# Patient Record
Sex: Female | Born: 1935 | Race: Black or African American | Hispanic: No
Health system: Southern US, Community
[De-identification: ages and names within clinical notes are randomized; demographics above are authoritative.]

---

## 2011-10-17 ENCOUNTER — Inpatient Hospital Stay: Payer: Self-pay | Admitting: Internal Medicine

## 2011-10-17 LAB — COMPREHENSIVE METABOLIC PANEL
Albumin: 2.4 g/dL — ABNORMAL LOW (ref 3.4–5.0)
Alkaline Phosphatase: 88 U/L (ref 50–136)
Anion Gap: 13 (ref 7–16)
BUN: 27 mg/dL — ABNORMAL HIGH (ref 7–18)
Bilirubin,Total: 0.5 mg/dL (ref 0.2–1.0)
Co2: 26 mmol/L (ref 21–32)
Creatinine: 1.59 mg/dL — ABNORMAL HIGH (ref 0.60–1.30)
EGFR (African American): 40 — ABNORMAL LOW
Osmolality: 289 (ref 275–301)
Potassium: 3.8 mmol/L (ref 3.5–5.1)
SGPT (ALT): 11 U/L — ABNORMAL LOW
Sodium: 141 mmol/L (ref 136–145)
Total Protein: 7.7 g/dL (ref 6.4–8.2)

## 2011-10-17 LAB — URINALYSIS, COMPLETE
Hyaline Cast: 18
Ketone: NEGATIVE
Nitrite: NEGATIVE
Ph: 5 (ref 4.5–8.0)
Protein: 100
Specific Gravity: 1.014 (ref 1.003–1.030)
WBC UR: 3885 /HPF (ref 0–5)

## 2011-10-17 LAB — CBC WITH DIFFERENTIAL/PLATELET
Basophil %: 0.2 %
Eosinophil #: 0 10*3/uL (ref 0.0–0.7)
HCT: 31.4 % — ABNORMAL LOW (ref 35.0–47.0)
HGB: 10.3 g/dL — ABNORMAL LOW (ref 12.0–16.0)
Lymphocyte #: 1 10*3/uL (ref 1.0–3.6)
Lymphocyte %: 9 %
MCHC: 32.9 g/dL (ref 32.0–36.0)
MCV: 87 fL (ref 80–100)
Neutrophil #: 8.9 10*3/uL — ABNORMAL HIGH (ref 1.4–6.5)
Neutrophil %: 79.6 %
WBC: 11 10*3/uL (ref 3.6–11.0)

## 2011-10-17 LAB — PRO B NATRIURETIC PEPTIDE: B-Type Natriuretic Peptide: 5271 pg/mL — ABNORMAL HIGH (ref 0–450)

## 2011-10-17 LAB — CK TOTAL AND CKMB (NOT AT ARMC)
CK, Total: 22 U/L (ref 21–215)
CK-MB: <0.5 ng/mL — ABNORMAL LOW (ref 0.5–3.6)

## 2011-10-17 LAB — RAPID INFLUENZA A&B ANTIGENS

## 2011-10-18 LAB — CBC WITH DIFFERENTIAL/PLATELET
Basophil #: 0 10*3/uL (ref 0.0–0.1)
Eosinophil %: 1 %
HCT: 30.6 % — ABNORMAL LOW (ref 35.0–47.0)
Lymphocyte #: 1.2 10*3/uL (ref 1.0–3.6)
MCH: 28.5 pg (ref 26.0–34.0)
MCHC: 32.5 g/dL (ref 32.0–36.0)
MCV: 88 fL (ref 80–100)
Monocyte #: 1.2 10*3/uL — ABNORMAL HIGH (ref 0.0–0.7)
Monocyte %: 11.7 %
Neutrophil #: 7.6 10*3/uL — ABNORMAL HIGH (ref 1.4–6.5)
Platelet: 411 10*3/uL (ref 150–440)
RDW: 14.2 % (ref 11.5–14.5)

## 2011-10-18 LAB — BASIC METABOLIC PANEL
Calcium, Total: 9.5 mg/dL (ref 8.5–10.1)
Creatinine: 1.39 mg/dL — ABNORMAL HIGH (ref 0.60–1.30)
EGFR (African American): 47 — ABNORMAL LOW
Glucose: 128 mg/dL — ABNORMAL HIGH (ref 65–99)

## 2011-10-18 LAB — PROTIME-INR
INR: 1.2
Prothrombin Time: 15.9 secs — ABNORMAL HIGH (ref 11.5–14.7)

## 2011-10-19 LAB — URINE CULTURE

## 2011-10-19 LAB — VANCOMYCIN, TROUGH: Vancomycin, Trough: 16 ug/mL (ref 10–20)

## 2011-10-20 LAB — BASIC METABOLIC PANEL
Anion Gap: 9 (ref 7–16)
Calcium, Total: 9.7 mg/dL (ref 8.5–10.1)
Co2: 29 mmol/L (ref 21–32)
Creatinine: 1.42 mg/dL — ABNORMAL HIGH (ref 0.60–1.30)
EGFR (African American): 46 — ABNORMAL LOW
EGFR (Non-African Amer.): 38 — ABNORMAL LOW
Glucose: 215 mg/dL — ABNORMAL HIGH (ref 65–99)
Sodium: 139 mmol/L (ref 136–145)

## 2011-10-20 LAB — CBC WITH DIFFERENTIAL/PLATELET
Basophil #: 0.1 10*3/uL (ref 0.0–0.1)
Eosinophil #: 0 10*3/uL (ref 0.0–0.7)
Eosinophil %: 0 %
Lymphocyte #: 0.6 10*3/uL — ABNORMAL LOW (ref 1.0–3.6)
Lymphocyte %: 3.9 %
MCH: 28.2 pg (ref 26.0–34.0)
MCHC: 32.7 g/dL (ref 32.0–36.0)
MCV: 86 fL (ref 80–100)
Monocyte %: 4.9 %
Neutrophil #: 13.6 10*3/uL — ABNORMAL HIGH (ref 1.4–6.5)
Platelet: 464 10*3/uL — ABNORMAL HIGH (ref 150–440)
RBC: 3.73 10*6/uL — ABNORMAL LOW (ref 3.80–5.20)

## 2011-10-20 LAB — BRONCHIAL WASH CULTURE

## 2011-10-21 LAB — CBC WITH DIFFERENTIAL/PLATELET
Basophil #: 0 10*3/uL (ref 0.0–0.1)
Basophil %: 0 %
Eosinophil %: 0 %
HGB: 9.9 g/dL — ABNORMAL LOW (ref 12.0–16.0)
Lymphocyte #: 0.6 10*3/uL — ABNORMAL LOW (ref 1.0–3.6)
MCH: 28.2 pg (ref 26.0–34.0)
MCV: 87 fL (ref 80–100)
Monocyte #: 0.9 10*3/uL — ABNORMAL HIGH (ref 0.0–0.7)
Monocyte %: 6.6 %
Neutrophil %: 89.2 %
Platelet: 436 10*3/uL (ref 150–440)
RBC: 3.52 10*6/uL — ABNORMAL LOW (ref 3.80–5.20)
WBC: 13.2 10*3/uL — ABNORMAL HIGH (ref 3.6–11.0)

## 2011-10-21 LAB — BASIC METABOLIC PANEL
Anion Gap: 6 — ABNORMAL LOW (ref 7–16)
Calcium, Total: 9.5 mg/dL (ref 8.5–10.1)
Chloride: 101 mmol/L (ref 98–107)
Co2: 30 mmol/L (ref 21–32)
Creatinine: 1.07 mg/dL (ref 0.60–1.30)
EGFR (Non-African Amer.): 52 — ABNORMAL LOW
Osmolality: 288 (ref 275–301)

## 2011-10-22 LAB — OCCULT BLOOD X 1 CARD TO LAB, STOOL: Occult Blood, Feces: POSITIVE

## 2011-10-22 LAB — CBC WITH DIFFERENTIAL/PLATELET
Basophil #: 0 10*3/uL (ref 0.0–0.1)
Basophil %: 0.2 %
Eosinophil %: 0.1 %
Lymphocyte #: 1.2 10*3/uL (ref 1.0–3.6)
MCHC: 32.1 g/dL (ref 32.0–36.0)
MCV: 87 fL (ref 80–100)
Monocyte %: 11.6 %
Neutrophil #: 8.6 10*3/uL — ABNORMAL HIGH (ref 1.4–6.5)
RBC: 3.61 10*6/uL — ABNORMAL LOW (ref 3.80–5.20)
RDW: 14.1 % (ref 11.5–14.5)
WBC: 11.2 10*3/uL — ABNORMAL HIGH (ref 3.6–11.0)

## 2011-10-23 LAB — CULTURE, BLOOD (SINGLE)

## 2011-12-01 LAB — TROPONIN I: Troponin-I: <0.02 ng/mL

## 2011-12-01 LAB — COMPREHENSIVE METABOLIC PANEL
Albumin: 3.1 g/dL — ABNORMAL LOW (ref 3.4–5.0)
Alkaline Phosphatase: 72 U/L (ref 50–136)
BUN: 19 mg/dL — ABNORMAL HIGH (ref 7–18)
Calcium, Total: 8.7 mg/dL (ref 8.5–10.1)
Co2: 20 mmol/L — ABNORMAL LOW (ref 21–32)
Creatinine: 1.15 mg/dL (ref 0.60–1.30)
EGFR (African American): 51 — ABNORMAL LOW
Glucose: 144 mg/dL — ABNORMAL HIGH (ref 65–99)
Osmolality: 290 (ref 275–301)
SGOT(AST): 17 U/L (ref 15–37)
SGPT (ALT): 10 U/L — ABNORMAL LOW
Total Protein: 6.7 g/dL (ref 6.4–8.2)

## 2011-12-01 LAB — CBC
HGB: 9 g/dL — ABNORMAL LOW (ref 12.0–16.0)
MCV: 86 fL (ref 80–100)
RBC: 3.5 10*6/uL — ABNORMAL LOW (ref 3.80–5.20)
RDW: 16.8 % — ABNORMAL HIGH (ref 11.5–14.5)

## 2011-12-02 ENCOUNTER — Inpatient Hospital Stay: Payer: Self-pay | Admitting: Internal Medicine

## 2011-12-02 LAB — TROPONIN I: Troponin-I: <0.02 ng/mL

## 2011-12-02 LAB — CBC WITH DIFFERENTIAL/PLATELET
Bands: 2 %
Eosinophil: 1 %
Lymphocytes: 22 %
Monocytes: 5 %
RBC: 3.23 10*6/uL — ABNORMAL LOW (ref 3.80–5.20)
Segmented Neutrophils: 70 %
WBC: 9.9 10*3/uL (ref 3.6–11.0)

## 2011-12-02 LAB — URINALYSIS, COMPLETE
Bilirubin,UR: NEGATIVE
Glucose,UR: NEGATIVE mg/dL (ref 0–75)
Hyaline Cast: 4
Nitrite: POSITIVE
Ph: 5 (ref 4.5–8.0)
RBC,UR: 3 /HPF (ref 0–5)
Specific Gravity: 1.014 (ref 1.003–1.030)
Squamous Epithelial: <1

## 2011-12-02 LAB — CK TOTAL AND CKMB (NOT AT ARMC)
CK, Total: 27 U/L (ref 21–215)
CK, Total: 20 U/L — ABNORMAL LOW (ref 21–215)
CK, Total: 20 U/L — ABNORMAL LOW (ref 21–215)
CK-MB: <0.5 ng/mL — ABNORMAL LOW (ref 0.5–3.6)
CK-MB: <0.5 ng/mL — ABNORMAL LOW (ref 0.5–3.6)

## 2011-12-03 LAB — CBC WITH DIFFERENTIAL/PLATELET
Basophil #: 0.1 10*3/uL (ref 0.0–0.1)
Basophil %: 1.3 %
HCT: 26.6 % — ABNORMAL LOW (ref 35.0–47.0)
HGB: 8.3 g/dL — ABNORMAL LOW (ref 12.0–16.0)
Lymphocyte #: 1.4 10*3/uL (ref 1.0–3.6)
Lymphocyte %: 18.5 %
Monocyte %: 10.7 %
Neutrophil #: 5.3 10*3/uL (ref 1.4–6.5)
Neutrophil %: 68.7 %
Platelet: 340 10*3/uL (ref 150–440)
RDW: 16.9 % — ABNORMAL HIGH (ref 11.5–14.5)
WBC: 7.7 10*3/uL (ref 3.6–11.0)

## 2011-12-03 LAB — BASIC METABOLIC PANEL
Anion Gap: 10 (ref 7–16)
BUN: 18 mg/dL (ref 7–18)
Calcium, Total: 9 mg/dL (ref 8.5–10.1)
Co2: 25 mmol/L (ref 21–32)
Creatinine: 1.22 mg/dL (ref 0.60–1.30)
EGFR (Non-African Amer.): 41 — ABNORMAL LOW
Osmolality: 286 (ref 275–301)
Potassium: 3.6 mmol/L (ref 3.5–5.1)
Sodium: 142 mmol/L (ref 136–145)

## 2011-12-03 LAB — MAGNESIUM: Magnesium: 1.7 mg/dL — ABNORMAL LOW

## 2011-12-04 LAB — CBC WITH DIFFERENTIAL/PLATELET
Basophil #: 0 10*3/uL (ref 0.0–0.1)
Basophil %: 0.8 %
Eosinophil #: 0.1 10*3/uL (ref 0.0–0.7)
Lymphocyte #: 1.4 10*3/uL (ref 1.0–3.6)
Platelet: 337 10*3/uL (ref 150–440)

## 2011-12-04 LAB — BASIC METABOLIC PANEL
Anion Gap: 10 (ref 7–16)
BUN: 18 mg/dL (ref 7–18)
Chloride: 108 mmol/L — ABNORMAL HIGH (ref 98–107)
Creatinine: 1.2 mg/dL (ref 0.60–1.30)
EGFR (African American): 48 — ABNORMAL LOW
EGFR (Non-African Amer.): 42 — ABNORMAL LOW
Glucose: 109 mg/dL — ABNORMAL HIGH (ref 65–99)
Potassium: 3.9 mmol/L (ref 3.5–5.1)

## 2011-12-05 LAB — BASIC METABOLIC PANEL
Anion Gap: 8 (ref 7–16)
Calcium, Total: 8.9 mg/dL (ref 8.5–10.1)
Chloride: 108 mmol/L — ABNORMAL HIGH (ref 98–107)
Creatinine: 1.3 mg/dL (ref 0.60–1.30)
EGFR (African American): 44 — ABNORMAL LOW
EGFR (Non-African Amer.): 38 — ABNORMAL LOW
Osmolality: 284 (ref 275–301)
Potassium: 3.5 mmol/L (ref 3.5–5.1)

## 2011-12-05 LAB — CBC WITH DIFFERENTIAL/PLATELET
Basophil #: 0.1 10*3/uL (ref 0.0–0.1)
Basophil %: 2.4 %
Eosinophil #: 0.2 10*3/uL (ref 0.0–0.7)
Eosinophil %: 3.1 %
Lymphocyte %: 27.6 %
MCH: 26.4 pg (ref 26.0–34.0)
MCHC: 32 g/dL (ref 32.0–36.0)
Monocyte %: 14.4 %
Neutrophil #: 3.2 10*3/uL (ref 1.4–6.5)
Neutrophil %: 52.5 %
RBC: 3.27 10*6/uL — ABNORMAL LOW (ref 3.80–5.20)
RDW: 16.9 % — ABNORMAL HIGH (ref 11.5–14.5)
WBC: 6.2 10*3/uL (ref 3.6–11.0)

## 2011-12-05 LAB — FOLATE: Folic Acid: 37.4 ng/mL (ref 3.1–100.0)

## 2011-12-05 LAB — IRON AND TIBC
Iron Bind.Cap.(Total): 381 ug/dL (ref 250–450)
Iron Saturation: 5 %
Unbound Iron-Bind.Cap.: 362 ug/dL

## 2011-12-05 LAB — T4, FREE: Free Thyroxine: 1.42 ng/dL (ref 0.76–1.46)

## 2011-12-06 LAB — CBC WITH DIFFERENTIAL/PLATELET
Basophil #: 0.1 10*3/uL (ref 0.0–0.1)
Eosinophil %: 3.2 %
HGB: 8.3 g/dL — ABNORMAL LOW (ref 12.0–16.0)
Lymphocyte #: 1.9 10*3/uL (ref 1.0–3.6)
Lymphocyte %: 35.8 %
MCHC: 32.1 g/dL (ref 32.0–36.0)
MCV: 82 fL (ref 80–100)
Monocyte %: 14.6 %
Neutrophil #: 2.4 10*3/uL (ref 1.4–6.5)
Neutrophil %: 45.3 %
Platelet: 291 10*3/uL (ref 150–440)
RBC: 3.13 10*6/uL — ABNORMAL LOW (ref 3.80–5.20)
RDW: 16.6 % — ABNORMAL HIGH (ref 11.5–14.5)
WBC: 5.4 10*3/uL (ref 3.6–11.0)

## 2011-12-06 LAB — URINE CULTURE

## 2012-02-03 DEATH — deceased

## 2013-05-10 IMAGING — CT CT CHEST W/O CM
1 of 2 series · 14 of 32 positions shown, 18 images · non-contrast
Comparison: none

REASON FOR EXAM: cough fever dyspnea
COMMENTS:

PROCEDURE:     CT  - CT CHEST WITHOUT CONTRAST  - October 17, 2011  [DATE]
RESULT:     Comparison: None
TECHNIQUE: Multiple axial images of the chest were obtained without
intravenous contrast.

[Series 3: lung windows · axial · 0.59mm/px · z∈[-346,-86]mm · 14 of 62 slices shown, 18 images]
[im 5/62  mediastinal]
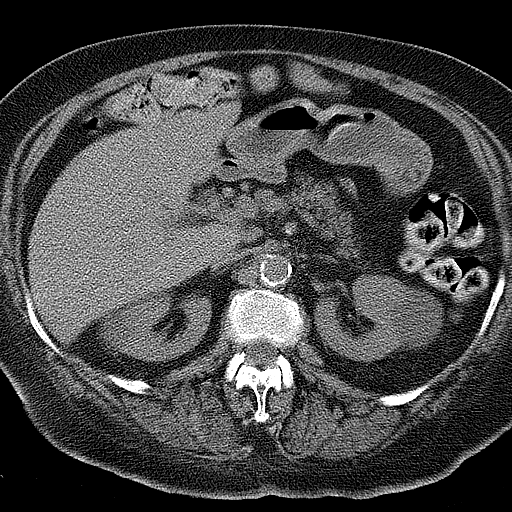
[im 5/62  lung]
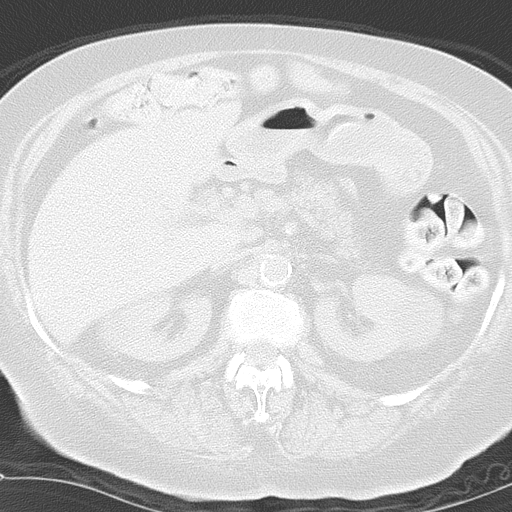
[im 10/62  lung]
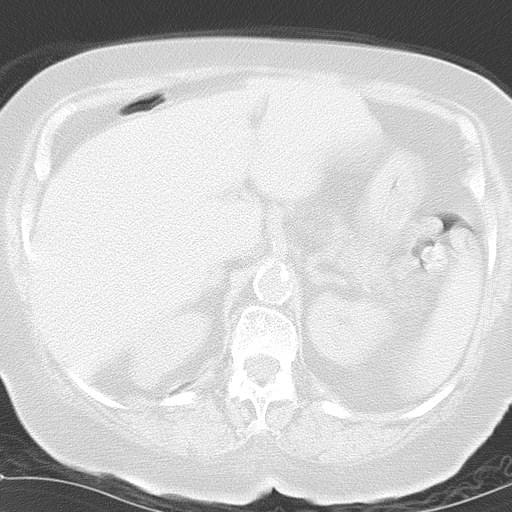
[im 15/62  lung]
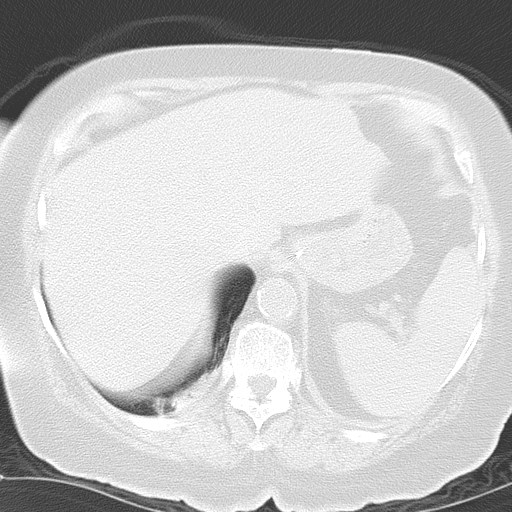
[im 19/62  lung]
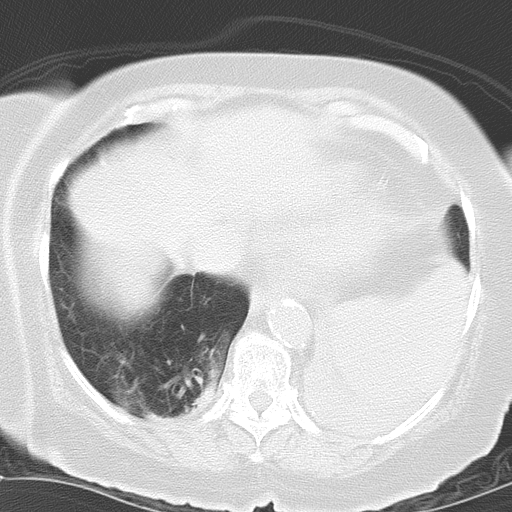
[im 24/62  mediastinal]
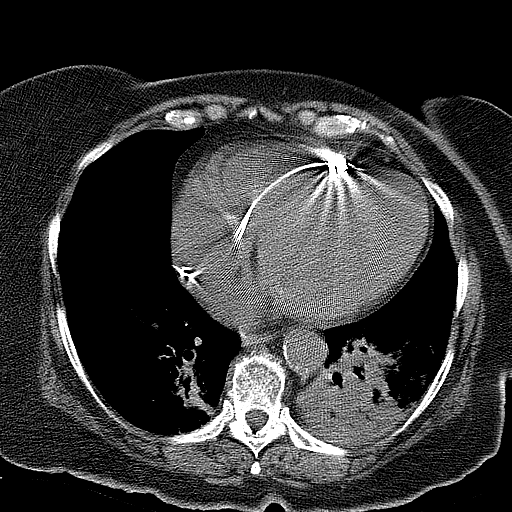
[im 24/62  lung]
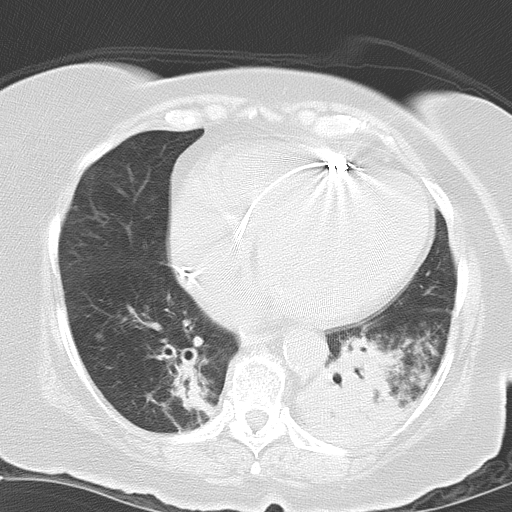
[im 28/62  lung]
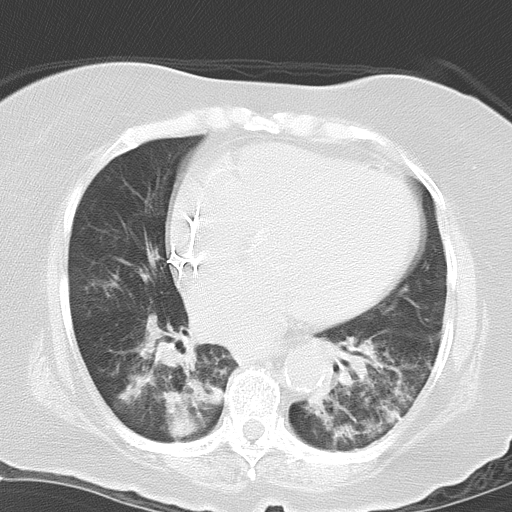
[im 29/62  lung]
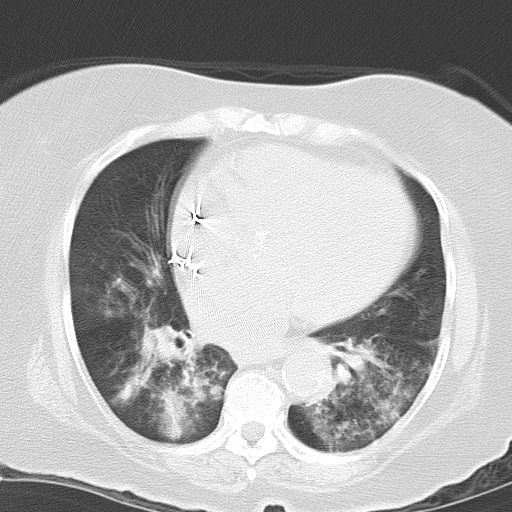
[im 31/62  lung]
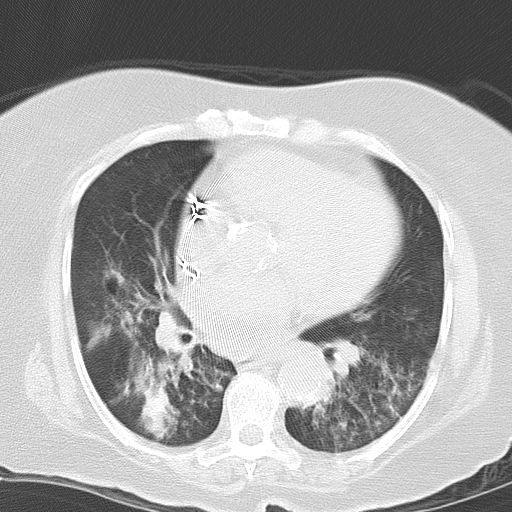
[im 33/62  mediastinal]
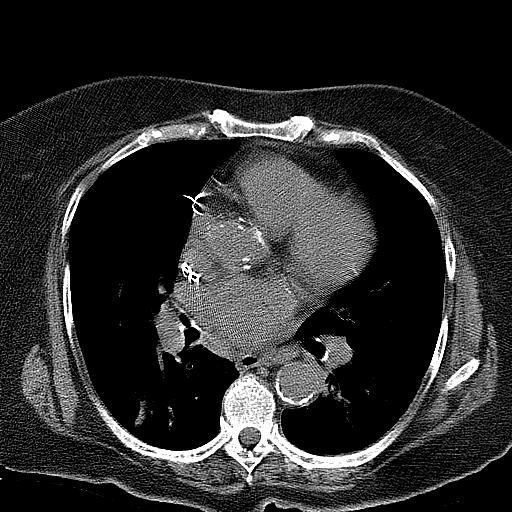
[im 33/62  lung]
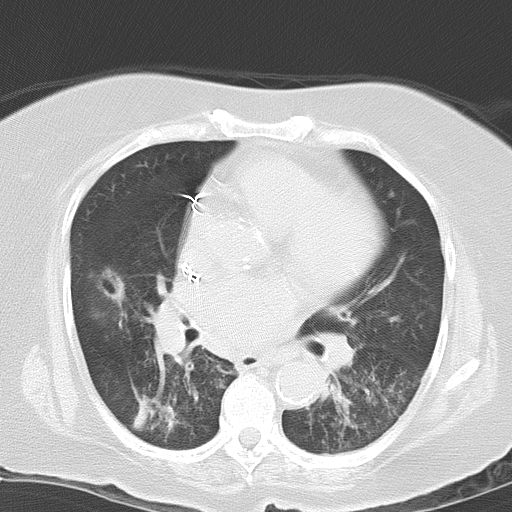
[im 38/62  lung]
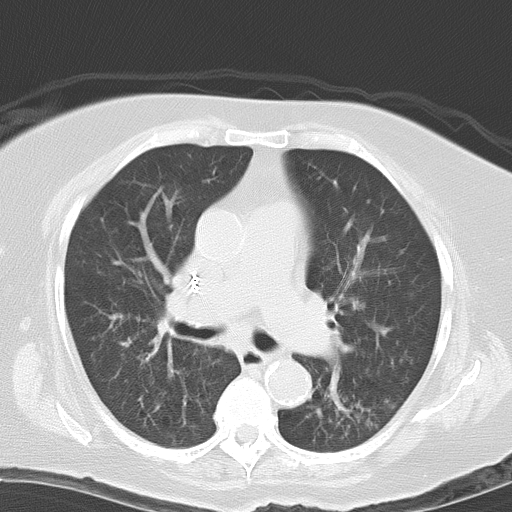
[im 43/62  lung]
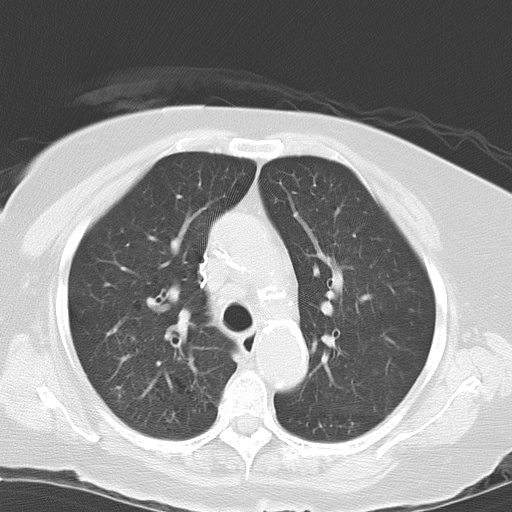
[im 47/62  lung]
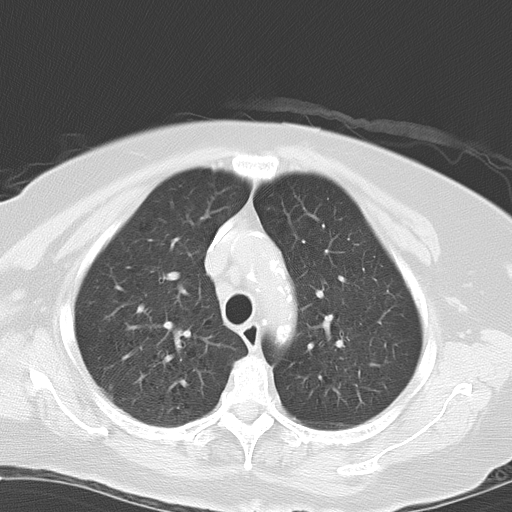
[im 52/62  mediastinal]
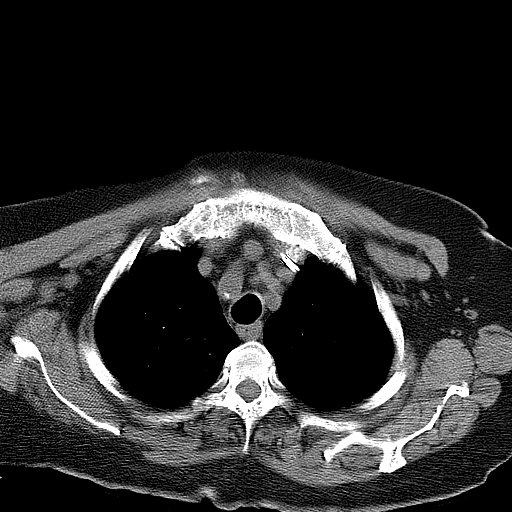
[im 52/62  lung]
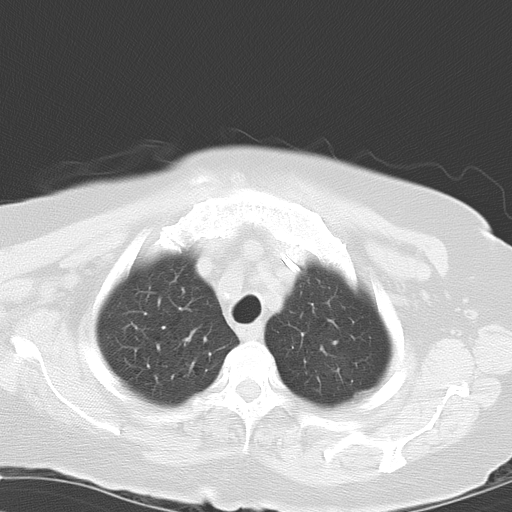
[im 57/62  lung]
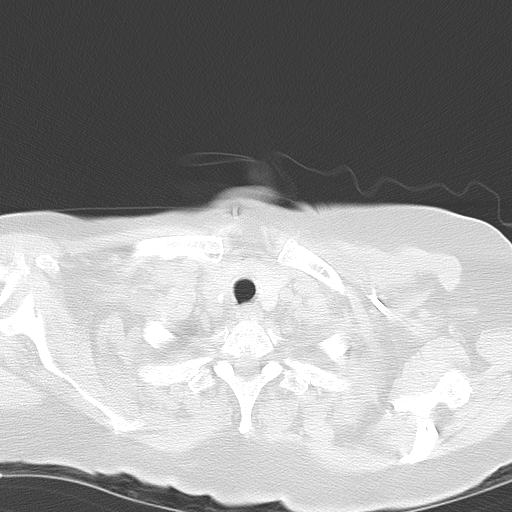

[14 of 32 positions shown; findings below may reference images not displayed]

FINDINGS: There are several prominent, but not pathologically enlarged, mediastinal
lymph nodes. Calcifications are seen in the coronary arteries. Pacemaker
wires are present. No hilar or axillary lymphadenopathy. Low-attenuation
lesions in the kidneys are consistent with cysts. There is a prominent
lobulation along the posterior superior right kidney which is indeterminate
and difficult to evaluate secondary to respiratory motion. This could
represent a complicated cyst. Multiple surgical clips seen in the left upper
quadrant.

There is mild centrilobular emphysema. There are heterogeneous opacities in
the right lower lobe and to a greater extent the left lower lobe concerning
for infection. There is a small round lucency in the posterior right middle
lobe with an area of thickening along the margin. This is indeterminate.

No aggressive lytic or sclerotic osseous lesions are identified.
IMPRESSION: 1. Heterogeneous opacities in the left lower lobe and right lower lobe are
concerning for infection. Followup is recommended to ensure resolution.
2. Small round lucency in the right lower lobe represent a bleb, but there
is some thickening along the wall. An area of cavitation cannot be excluded.
 A followup noncontrast chest CT is recommended in 3 months to ensure
resolution.
3. Indeterminate lesion in the superior pole of the right kidney which may
represent a complicated cyst. Further evaluation with renal ultrasound is
suggested.

The final report was called to Dr. Pierce Pavon at 3135 hours 10/17/2011

## 2014-11-27 NOTE — Discharge Summary (Signed)
PATIENT NAME:  Morgan Shepherd, Morgan Shepherd MR#:  [id number] DATE OF BIRTH:  13-Nov-1925  DATE OF ADMISSION:  10/17/2011 DATE OF DISCHARGE:  10/22/2011  ADMITTING PHYSICIAN: Larena Glassman, MD  DISCHARGING PHYSICIAN: Enid Baas, MD  PRIMARY CARE PHYSICIAN: Lorie Phenix, MD  CONSULTANTS: Erin Fulling, MD - Pulmonary.   DISCHARGE DIAGNOSES:  1. Multilobar pneumonia.  2. Acute respiratory failure.  3. Extended-spectrum beta-lactamase Escherichia coli left lower lobe pneumonia.  4. Extended-spectrum beta-lactamase Escherichia coli urinary tract infection.  5. Hypertension.  6. Diabetes mellitus.  7. Chronic anemia.  8. History of cerebrovascular accident.  9. Cardiomyopathy.  10. Congestive heart failure with chronic systolic dysfunction, unknown ejection fraction.  11. Aortic insufficiency.  12. Acute renal failure while in the hospital, resolved.   DISCHARGE HOME MEDICATIONS:  1. Lantus 10 units subcutaneous at bedtime.  2. Colace 100 mg p.o. twice a day. 3. Detrol LA 4 mg p.o. daily.  4. Aspirin 81 mg p.o. daily.  5. Imdur 30 mg p.o. daily.  6. Toprol 25 mg p.o. daily.  7. Florastor 250 mg capsule daily.  8. Zocor 20 mg p.o. daily.  9. Tylenol extra-strength capsule 500 mg every six hours as needed.  10. Lexapro 20 mg daily.  11. Percocet 5/325 mg one tablet p.o. every six hours p.r.n.  12. Prednisone 50 mg p.o. daily taper off x 10 mg every day.  13. Invanz 1 gram IV every 24 hours via PICC line until 11/02/2011.  14. Mucinex 600 mg p.o. twice a day for cough for five days.  15. Lasix 20 mg p.o. daily.  16. Tramadol 50 mg p.o. every six hours p.r.n.   DISCHARGE DIET: Low sodium, ADA diet.  HOME OXYGEN: None.   DISCHARGE ACTIVITY: As tolerated.  FOLLOWUP INSTRUCTIONS: Primary care physician follow-up in 1 to 2 weeks. Take out PICC line after finishing the antibiotic treatments.   LABS AND IMAGING STUDIES: WBC 11.2, hemoglobin 10.0, hematocrit 31.3, platelet count 431.    Sodium 137, potassium 3.9, chloride 101, bicarbonate 30, BUN 42, creatinine 1.07, glucose 165, calcium 9.5.   Stool for occult blood is positive.  Bronchial cultures are negative.   Sputum culture is growing moderate growth of ESBL Escherichia coli resistant to fluoroquinolones and sensitive only to carbapenem and gentamicin.  Urinalysis is positive for 3+ leukocyte esterase, 3+ bacteria, and more than 3000 WBCs.   Urine culture is growing ESBL Escherichia coli sensitive only to carbapenem.   Blood cultures remain negative.   Chest x-ray on admission showed increased density in the left base compatible with infiltrate or atelectasis and increased density in the right base, possible pneumonia, and also cardiomegaly is present.   Influenza test is negative.   CT of the chest without contrast showed heterogenous opacities in the left lower lobe and right lower lobe concerning for infection. There is also small rounded lucency in the right lower lobe representing a bleb or thickening of the wall. Area of cavitation cannot be excluded. Follow-up CT is recommended in three months for complete resolution.   There is a questionable cyst on the kidney versus solid mass seen on the CT of the chest so ultrasound of the kidney was done which showed multiple right renal cysts. No solid mass lesion seen on the right side and no hydronephrosis.   Repeat chest x-ray on 10/21/2011 showed shallow inspiration without evidence of acute cardiopulmonary disease and decreased density of the left lung base opacity.  BRIEF HOSPITAL COURSE: [name] is an 81 year old African American  female with past medical history of hypertension, diabetes, aortic insufficiency, and congestive heart failure who presented from [facility] skilled nursing facility with dyspnea, cough, and [facility]  1. Acute respiratory failure secondary to multilobar pneumonia caused by ESBL Escherichia coli -  the patient [place] [facility] [place] [facility] followed by going to [facility], and having pneumonia. So this was considered as healthcare-acquired pneumonia and she was initially started on broad-spectrum antibiotics. Sputum culture is growing ESBL Escherichia coli. The patient also had a bronchoscopy done and cultures, which are negative so far. Her antibiotics have been changed to Invanz, from 10/20/2011, and will need two weeks of IV Invanz. A PICC line will be placed prior to discharge and that needs to be taken out after she finishes up her IV antibiotics. She was also on a prednisone taper for reactive airway disease while in the hospital. 2. ESBL urinary tract infection - continue Invanz as mentioned above for two weeks. 3. Hypertension - the patient's blood pressure is well controlled with Imdur and Toprol.  4. Congestive heart failure, chronic systolic dysfunction - the patient is on Lasix and appears well compensated at this time. She is also on Toprol and also aspirin. 5. Acute renal failure, likely prerenal - it improved while in the hospital so Lasix dose has been decreased and she will be monitored as an outpatient.   Her course has been otherwise uneventful. She has remained mostly bedbound, which is her baseline status, and she worked only a little with physical therapy. She will be discharged back to the skilled nursing facility, at [facility].   DISCHARGE CONDITION: Stable.   DISCHARGE DISPOSITION: To skilled nursing facility.   TIME SPENT ON DISCHARGE: 45 minutes. ____________________________ [name], MD rk:slb D: 10/22/2011 13:26:41 ET T: 10/22/2011 13:41:31 ET JOB#: [id number]  cc: Enid Baasadhika Khary Schaben, MD, <Dictator> Leo GrosserNancy J. Maloney, MD Enid BaasADHIKA Khalen Styer MD ELECTRONICALLY SIGNED 10/23/2011 13:51

## 2014-11-27 NOTE — Discharge Summary (Signed)
PATIENT NAME:  Morgan Shepherd, Morgan Shepherd MR#:  [id number] DATE OF BIRTH:  [birth year]  DATE OF ADMISSION:  12/02/2011 DATE OF DISCHARGE:  12/06/2011  ADMITTING DIAGNOSIS: Altered mental status.   DISCHARGE DIAGNOSES:  1. Acute encephalopathy likely multifactorial including urinary tract infection as well as possible acute systolic congestive heart failure.  2. Acute on chronic systolic congestive heart failure, currently compensated. Extended-spectrum beta-lactamase producing Escherichia coli, currently on Invanz.   3. Malignant hypertension. Blood pressure is improved.  4. Chronic anemia likely due to anemia of chronic disease.  5. Mild anion gap acidosis resolved with intravenous hydration.  6. History of multilobar pneumonia in the past.  7. Aortic insufficiency.  8. Severe systolic congestive heart failure.  9. Chronic renal insufficiency.  10. History of gastrointestinal bleed.  11. History of cerebrovascular accident with residual right-sided deficit.  12. Coronary artery disease with myocardial infarction in the past.  13. Hypertension.  14. Diabetes.  15. History of renal cyst.  16. Hyperlipidemia.  17. Status post right hand surgery.  18. Status post bilateral cataract surgery.  19. Status post pacemaker placement.  20. Status post right knee surgery x4.   LABORATORY, DIAGNOSTIC AND RADIOLOGICAL DATA: ABG: pH 7.44, pCO2 38, pO2 87. Urinalysis with specific gravity 1.014, pH 5, positive nitrites, 2+ leukocyte esterase, RBCs 31, WBCs 31, 2+ bacteria. WBC count 10.6, hemoglobin 9, hematocrit 30, platelet count 403, glucose 144, BUN 19, creatinine 1.15, sodium 143, potassium 4.4, chloride 111, CO2 20, calcium 8.7. LFTs: Total bilirubin 0.5, alkaline phosphatase 72, ALT 10, AST 17. Troponin less than 0.02. EKG with paced rhythm. No significant ST-T wave changes. Chest x-ray with pulmonary congestion bilaterally. Subsequent urine culture showed Escherichia coli ESBL producing. Patient's TSH was  0.247, magnesium 1.7. Most recent CBC today: Hemoglobin 8.3, hematocrit 25.8. Echocardiogram of the heart showed severe global hypokinesis of the left ventricle, ejection fraction 20%, left atrium mildly dilated with tricuspid regurgitation.   CONSULTANT: Case Production designer, theatre/television/filmmanager.   HOSPITAL COURSE: Please see history and physical done by the admitting physician. Patient is an 81 year old white female who resides at [facility] was sent for altered mental status per nursing facility. Patient by the time she arrived in the ED was more awake and alert. She was noted to have a urinary tract infection, possible congestive heart failure. Patient was started on broad-spectrum antibiotics. Urine cultures did come back positive for extended-spectrum beta-lactamase producing Escherichia coli therefore she was continued on Invanz. Patient had a PICC line placed and she will finish the course of antibiotics. In terms of her congestive heart failure, patient was treated with IV Lasix and now on p.o. Lasix. She is currently doing better, is more mentally awake and oriented. She tends to be a little drowsy. Therefore, Provigil was added to her current regimen. Patient also has chronic anemia and will need to have follow-up hemoglobin periodically, may need transfusion intermittently. At this time she is stable for discharge.   DISCHARGE MEDICATIONS:  1. Lantus 10 units subcutaneous at bedtime.  2. Colace 100, 1 tab p.o. b.i.d.  3. Detrol LA 4 mg daily.  4. Aspirin 81, 1 tab p.o. daily.  5. Imdur 30 daily.  6. Toprol-XL 25 daily.  7. Florastor 250 mg daily.  8. Percocet 10/325, 1 tab p.o. q.4 p.r.n.  9. Tylenol extra strength 1 tab p.o. q.6. 10. Loperamide 2 mg q.6 p.r.n. for diarrhea.  11. Lexapro 20 daily.  12. Lasix 20 p.o. daily.  13. Percocet 5/325 q.6 p.r.n. pain. 14. Mucinex  600, 1 tab p.o. b.i.d.  15. Enalapril 5 mg daily.  16. Invanz 1 gram IV q.24 x6 more days. 17. Provigil 100 p.o. daily.  DIET: ADA  diet, mechanical soft with thin liquids, meats ground with gravy added to moisten, strict aspiration precautions. Must sit up fully upright.   ACTIVITY: Up with assistance. Advance as tolerated.   REFERRAL: PT and OT.   TIMEFRAME FOR FOLLOW UP: 1 to 2 weeks with Dr. Lorie Phenix. Patient to have CBC and BMP check in one week due to antibiotics and anemia.   TIME SPENT: 35 minutes spent.   ____________________________ Lacie Scotts. Allena Katz, MD shp:cms D: 12/06/2011 10:57:08 ET T: 12/06/2011 11:16:50 ET JOB#: [id number]  cc: Avin Upperman H. Allena Katz, MD, <Dictator> Charise Carwin MD ELECTRONICALLY SIGNED 12/06/2011 15:26

## 2014-11-27 NOTE — H&P (Signed)
PATIENT NAME:  Morgan Shepherd, Morgan Shepherd MR#:  [id number] DATE OF BIRTH:  1925-10-01  DATE OF ADMISSION:  10/17/2011  REFERRING PHYSICIAN: Dr. Bayard Males PRIMARY CARE PHYSICIAN: Dr. Lorie Phenix   REASON FOR ADMISSION: Multilobar pneumonia, cough, fever, failed Levaquin treatment.   HISTORY OF PRESENT ILLNESS: This is an 81 year old African American female with history of hypertension, diabetes, aortic insufficiency, possible chronic renal insufficiency who presents to the ER with worsening cough, fever, sputum. Patient said she has had cold-like symptoms with green sputum, fever. She was told she had pneumonia and was started on Levaquin. She demanded to be brought to the hospital because she said she did not feel well. She denies any chest pain, shortness breath, vomiting, diarrhea, fevers, chills, shakes. She said she did have some nausea. She said she has been on Levaquin for about a week. Of note: She entered the facility on 02/28 after being hospitalized for what she says is recurrent falls at [facility] in [place]. She came to the ER, found to have low-grade temperature of 100.4. Was given azithromycin, ceftriaxone, morphine. We are asked to admit the patient for multilobar pneumonia.    PAST MEDICAL HISTORY:  1. Aortic insufficiency. 2. Congestive heart failure. 3. Chronic renal failure.  4. GI bleed. 5. Anemia. 6. Cerebrovascular accident.  7. Myocardial infarction in the past.  8. Recent bacteremia sepsis.    PAST SURGICAL HISTORY:  1. Right hand surgery.  2. Pacemaker placement.   ALLERGIES: NSAIDs, penicillin, sulfa.   FAMILY HISTORY: Father died at age 66 of tuberculosis. Mother died at age 59 of coronary artery disease.   SOCIAL HISTORY: She is widowed with one healthy son. Does not smoke, drink, use illicit drugs. She used to be a Neurosurgeon. She said that she was living alone in [facility] but now is not able to take care of herself and has been living  at [facility] since she states 02/28 but based on discharge summary from [facility] she may have gone on 01/28.    REVIEW OF SYSTEMS: CONSTITUTIONAL: Fever, fatigue, weakness is present. EYES: No blurred vision, double vision, pain, redness, inflammation, glaucoma. ENT: No tinnitus, ear pain, hearing loss, seasonal allergies, epistaxis, discharge. RESPIRATORY: She has cough. No dyspnea, hemoptysis. She does have sputum. CARDIOVASCULAR: No chest pain, orthopnea, edema, arrhythmias, dyspnea on exertion, palpitations. GASTROINTESTINAL: Nausea but no vomiting. She did have diarrhea which has now resolved. No abdominal pain, hematemesis, melena, gastroesophageal reflux disease. GENITOURINARY: No dysuria, hematuria, renal calculi, increased frequency, incontinence. GYN/BREASTS: No breast mass, tenderness, vaginal discharge. ENDOCRINE: No polyuria, nocturia, thyroid problems, increased sweating, heat or cold intolerance. HEME/LYMPH: She has anemia but no easy bruising, swollen glands. INTEGUMENT: No acne, rash, change in mole, hair, or skin. MUSCULOSKELETAL: She has right-sided weakness in her right upper and lower extremities from her previous stroke. No pain in neck, shoulder, knee, hip, arthritis, swelling, gout. NEUROLOGIC: No numbness. She has weakness on the right side. No dysarthria, epilepsy, tremor, vertigo, ataxia. PSYCH: No anxiety, insomnia, ADD, bipolar, depression.   PHYSICAL EXAMINATION:  VITAL SIGNS: Temperature 100.4, heart rate 95, blood pressure 125/70, sating 95% on room air, respiratory rate 18.   GENERAL: Patient is well developed, well nourished, high BMI.   HEENT: Pupils equal, reactive to light and accommodation. Extraocular movements intact. Anicteric sclerae. No difficulty hearing. Oropharynx clear. She has dry mucous membranes.  NECK: No JVD. No thyromegaly. No lymphadenopathy. No carotid bruits.    CARDIOVASCULAR: Regular rate and rhythm. Normal S1,  S2. She has 3/6  systolic ejection murmur consistent with aortic stenosis radiating to the neck. PMI is not lateralized. 2+ dorsalis pedis pulses with no lower extremity edema.   RESPIRATORY: She has some coarse breath sounds, decreased breath sounds at the base. No use of accessory muscles or increased effort. Resonant to percussion.   BREASTS: No obvious masses.    ABDOMEN: Soft, nontender, nondistended. Positive bowel sounds.   GENITOURINARY: She has Foley in place.   MUSCULOSKELETAL: Strength 5/5 on left side; right side has 4/5 strength. No cyanosis, degenerative joint disease, kyphosis.   SKIN: She has dry skin with tenting of her skin. No erythema, nodules. She was warm to touch.   LYMPH: No lymphadenopathy in cervical or supraclavicular area.   NEUROLOGIC: Cranial nerves II through XII are intact. +2 reflexes. No aphasia, dysarthria, or contractures. She has right-sided weakness with 4/5 strength in both her right upper and lower extremities.   PSYCH: Alert and oriented x3 but she has poor judgment.   LABORATORY, DIAGNOSTIC, AND RADIOLOGICAL DATA: Glucose 147, BUN 27, creatinine 1.57, sodium 141, potassium 3.8, chloride 102, bicarbonate 26, anion gap 13, total protein 7.7, total bilirubin 0.5, alkaline phosphatase 88, AST 22, ALT 11, CK 22, MB less than 0.5, troponin 0.02, WBC 11, hemoglobin 10.3, hematocrit 31.4, platelets 431. Urinalysis showed bacteria 3+, WBC 3850, leukocyte esterase 3+. Chest x-ray: Increased density left base compatible with infiltrate or atelectasis. There is slight hazy increased density at the right base conspicuous for atelectasis or pneumonia as well as cardiomegaly. CT 10/17/2011 showed heterogenous opacities left lower lobe, right lower base concerning for infection. Follow-up recommended to ensure. Small amount of lucency lower lobe represents blood but there could be thickening. Cavitation cannot be excluded. Indeterminate lesion superior pole of the right kidney which may  represent complicated cyst. EKG shows heart rate 94, electronic paced.   ASSESSMENT AND PLAN: This is an 81 year old African American female with past medical history of cerebrovascular accident, hypertension, diabetes, chronic renal insufficiency, aortic insufficiency status post pacemaker placement [place] [facility] for coagulase-negative staph bacteremia, coagulase-negative pneumonia, elevated troponin who presents with cough, sputum, fever.  1. Multilobar pneumonia, hospital-acquired pneumonia as she has been recently hospitalized. Would change to vancomycin and Zosyn. She has a penicillin allergy which she had many years she had slight rash. I have discussed pharmacy and we agreed to go with this. If she does develop rash will give Benadryl. Will get sputum, blood cultures, nebulizers, and mucolytics. In addition, patient has urinary tract infection so we will continue treatment with vancomycin and Zosyn and send off urine cultures.  2. Cavitary lesion, possible blebs right lower lobe. We have consulted pulmonary.  3. Acute renal failure versus chronic renal insufficiency. Will hold Lasix. Will try to get records to find out her baseline creatinine. This could be dehydration from urinary tract infection, pneumonia and sepsis versus systemic inflammatory response syndrome. Will get a renal ultrasound as patient has complicated cyst that needs to be evaluated. Will try to obtain old records. 4. Anemia. Patient had history of GI bleed. Will get Hemoccult and CBC and get old records to see what her baseline hemoglobin is.  5. Hypertension. Will continue with metoprolol.   6. Aortic insufficiency. We are holding Lasix as patient currently looks to be hypovolemic.  7. Diabetes. Will continue sliding scale insulin and Lantus. Do Accu-Cheks.  8. History of cerebrovascular accident. Will continue aspirin, Zocor.  9. Coronary artery disease. Will continue metoprolol, aspirin,  Zocor. 10. Urinary tract infection as noted above. Will continue treatment with vancomycin and Zosyn. Send off urine cultures.  11. Deep vein thrombosis prophylaxis. Maintain with aspirin, heparin subcutaneous.  12. CODE STATUS: Full code.   TOTAL TIME SPENT ON ADMISSION: 55 minutes.   ____________________________ Corie Chiquito Lafayette Dragon, MD aaf:cms D: 10/17/2011 08:53:34 ET T: 10/17/2011 09:32:39 ET JOB#: [id number]  cc: Karolee Ohs A. Lafayette Dragon, MD, <Dictator> Leo Grosser, MD Dr. Colin Broach  Karolee Ohs Laverda Page MD ELECTRONICALLY SIGNED 10/17/2011 16:17

## 2014-11-27 NOTE — Op Note (Signed)
PATIENT NAME:  Morgan Shepherd, Morgan Shepherd MR#:  [id number] DATE OF BIRTH:  [birth year]  DATE OF PROCEDURE:  10/22/2011  PREOPERATIVE DIAGNOSES:  1. Escherichia coli pneumonia.  2. Escherichia urinary tract infection.  3. Acute renal failure secondary to sepsis.   POSTOPERATIVE DIAGNOSES: 1. Escherichia coli pneumonia.  2. Escherichia urinary tract infection.  3. Acute renal failure secondary to sepsis.   PROCEDURE PERFORMED: Insertion of right arm PICC line.   INDICATION FOR PICC LINE: Need for antibiotics greater than five days.   SURGEON: Renford DillsGregory G. Dia Jefferys, MD   PROCEDURE: The patient is brought to Special Procedures and placed in the supine position with her right arm extended palm upward. Right arm is prepped and draped in a sterile fashion. Ultrasound is placed in a sterile sleeve. Ultrasound is utilized secondary to lack of appropriate landmarks and to avoid vascular injury. Under direct ultrasound visualization, the brachial vein is identified. It is echolucent, homogeneous, and easily compressible indicating patency. Image is recorded for the permanent record. Under continuous real-time ultrasound guidance, micropuncture needle is inserted into the brachial vein. Microwire is then advanced. Skin has been anesthetized with 1% lidocaine. Under fluoroscopy the wire is positioned with its tip at the atriocaval junction, appropriate measurements are then made, and a Morpheus single-lumen PICC line is trimmed to 36 cm. PICC line is then advanced over the wire through the peel-away sheath. Wire and peel-away sheath is removed. PICC line is secured to the skin of the arm with a StatLock and a sterile dressing. PICC line aspirates easily and flushes well and is packed with heparinized saline. The patient tolerated the procedure well and there were no complications.   ____________________________ Renford DillsGregory G. Elad Macphail, MD ggs:drc D: 10/23/2011 12:46:11 ET T: 10/23/2011 13:15:11 ET JOB#: [id number] Latina CraverGREGORY G  Soley Harriss MD ELECTRONICALLY SIGNED 10/25/2011 17:44

## 2014-11-27 NOTE — Op Note (Signed)
PATIENT NAME:  Morgan Shepherd, Morgan Shepherd MR#:  [id number] DATE OF BIRTH:  21-Mar-1926  DATE OF PROCEDURE:  12/05/2011  PREOPERATIVE DIAGNOSIS:   1. Urinary tract infection requiring extended intravenous antibiotics.  2. Malignant hypertension.  POSTOPERATIVE DIAGNOSIS:  1. Urinary tract infection requiring extended intravenous antibiotics. 2. Malignant hypertension.  PROCEDURES:  1. Ultrasound guidance for vascular access to right basilic vein.  2. Fluoroscopic guidance for placement of catheter.  3. Insertion of peripherally inserted central venous catheter, right arm.  SURGEON: Festus BarrenJason Trenisha Lafavor, MD  ANESTHESIA: Local.   ESTIMATED BLOOD LOSS: Minimal.   INDICATION FOR PROCEDURE: The patient is a female with a resistant organism urinary tract infection that will require extended intravenous antibiotics for over one week.  She also has hypertension and multiple other comorbidities.  A PICC line is requested.  DESCRIPTION OF PROCEDURE: The patient's right arm was sterilely prepped and draped, and a sterile surgical field was created. The right basilic vein was accessed under direct ultrasound guidance without difficulty with a micropuncture needle and permanent image was recorded. 0.018 wire was then placed into the superior vena cava. Peel-away sheath was placed over the wire. A single lumen peripherally inserted central venous catheter was then placed over the wire and the wire and peel-away sheath were removed. The catheter tip was placed into the superior vena cava and was secured at the skin at 38 cm with a sterile dressing. The catheter withdrew blood well and flushed easily with heparinized saline.     The patient tolerated procedure well.  ____________________________ Annice NeedyJason S. Promyse Ardito, MD jsd:cbb D: 12/09/2011 09:48:06 ET T: 12/09/2011 11:01:29 ET JOB#: [id number]  cc: Annice NeedyJason S. Lorik Guo, MD, <Dictator> Annice NeedyJASON S Chea Malan MD ELECTRONICALLY SIGNED 12/14/2011 7:39

## 2014-11-27 NOTE — H&P (Signed)
PATIENT NAME:  Morgan Shepherd, Morgan Shepherd MR#:  [id number] DATE OF BIRTH:  1926/08/01  DATE OF ADMISSION:  12/02/2011  REFERRING PHYSICIAN: Dr. Dolores Frame PRIMARY CARE PHYSICIAN: Dr. Lorie Phenix   PRESENTING COMPLAINT: Altered mental status and now patient complaining of abdominal pain.   HISTORY OF PRESENT ILLNESS: Morgan Shepherd is a pleasant 81 year old woman who presents from [facility]. When she arrived EMS reports altered mental status per the nursing facility. She is currently alert, answering questions appropriately but nursing staff did report transient episode of somnolence. Patient reports that she has been having some lower abdominal pain for about a week but worsened yesterday and she has developed some shortness of breath, but also adds that she gets short of breath if she does not get her medications on time. She currently denies any chest pain or shortness of breath. No palpitations, presyncope, or syncope. Reports that she has been coughing since her diagnosis of pneumonia but mainly to clear her throat, nonproductive. She denies any fevers or chills. No nausea or vomiting. No diarrhea or constipation. No bloody stools or hematemesis.   PAST MEDICAL HISTORY:  1. Admitted 03/14 to 10/22/2011 for management of multilobar pneumonia and respiratory failure in the setting of ESBL Escherichia coli pneumonia and also concomitant ESBL Escherichia coli urinary tract infection.  2. Aortic insufficiency.  3. Systolic dysfunction congestive heart failure.  4. Chronic renal insufficiency.  5. History of gastrointestinal bleed.  6. Chronic anemia.  7. History of cerebrovascular accident, patient reports residual right-sided deficit.  8. Coronary artery disease and myocardial infarction in the past.  9. Hypertension.  10. Diabetes.  11. Renal cysts.  12. Hyperlipidemia. 13. Of note on her last admission the CT scan of the chest showed heterogeneous opacities in the left lower lobe and right lower lobe  concerning for infection. There is also small rounded lucency in the right lower lobe representing a bleb or thickening of the wall. Area of cavitation could not be excluded and recommended for repeat CT scan.   PAST SURGICAL HISTORY:  1. Right hand surgery.  2. Bilateral cataract surgery.  3. Pacemaker placement.  4. Right knee surgery x4 according to patient.   ALLERGIES: Meperidine, Neurontin, Nexium, NSAIDs, penicillin, sulfa drugs, latex.   FAMILY HISTORY: Father died at age 102 of tuberculosis. Mother died at age 80 of coronary artery disease.   SOCIAL HISTORY: She is widowed. She denies any tobacco, alcohol or drug use. She previously was a Neurosurgeon. She was living alone in [facility] prior to being admitted to [facility] when she was discharged from [facility] in [place].   REVIEW OF SYSTEMS: CONSTITUTIONAL: Denies fevers, chills, nausea, or vomiting. EYES: History of cataracts. ENT: No epistaxis, discharge, or dysphagia. RESPIRATORY: She reports cough with clearing of throat but no productive sputum or hemoptysis. CARDIOVASCULAR: Denies any palpitations, presyncope, syncope. No chest pain, orthopnea, or PND. GASTROINTESTINAL: No nausea, vomiting. She endorses lower abdominal pain as per history of present illness. GENITOURINARY: No dysuria, hematuria. ENDO: No polyuria, polydipsia. HEMATOLOGIC: No easy bleeding. SKIN: No ulcers. MUSCULOSKELETAL: Denies any back pain or joint pain. NEUROLOGIC: History of CVA with right-sided deficit. PSYCH: Denies any depression or suicidal ideation.   PHYSICAL EXAMINATION:  VITAL SIGNS: Temperature 96.7, pulse 82, respiratory rate 28, blood pressure 194/115, current blood pressure 156/85, sating 98% on 2 liters.   GENERAL: Lying in bed in no apparent distress.   HEENT: Normocephalic, atraumatic. Pupils equal, symmetric. Nares with nasal cannula in place. Slightly dry  mucous membrane.   NECK: Soft and supple. No  adenopathy. She has elevated JVP of approximately 8 cm.   CARDIOVASCULAR: Non-tachy. She has a soft systolic murmur. No rubs or gallops.   LUNGS: Faint basilar crackles. No use of accessory muscles or increased respiratory effort.   ABDOMEN: Soft. Positive bowel sounds. She is mildly tender on deep palpation of the lower abdomen. No rebound or guarding. No mass appreciated.   EXTREMITIES: No edema. Dorsal pedis pulses intact.   MUSCULOSKELETAL: No joint effusion. She has scarring over her right knee.   NEUROLOGIC: No dysarthria or aphasia. She actually has symmetrical squeeze of bilateral upper extremities.   PSYCH: She is alert and she is oriented and answering questions appropriately.   LABORATORY, DIAGNOSTIC AND RADIOLOGICAL DATA: ABG with pH 7.44, pCO2 38, pO2 87. Urinalysis with specific gravity of 1.014, pH 5, positive nitrite, 2+ leukocyte esterase, 3 per high-power field RBC, 31 per high-power field WBC, 2+ bacteria. WBC 10.6, hemoglobin 9, hematocrit 30, platelets 403, MCV 86, glucose 144, BUN 19, creatinine 1.15, sodium 143, potassium 4.4, chloride 111, carbon dioxide 20, calcium 8.7, total bilirubin 0.5, alkaline phosphatase 72, ALT 10, AST 17, total protein 6.7, albumin 3.1, troponin less than 0.02. EKG with paced rhythm of 81. No ST elevation or depression. Chest x-ray with pulmonary congestion bilaterally. No infiltrate appreciated.   ASSESSMENT AND PLAN: Morgan Shepherd is a pleasant 81 year old woman with history of coronary artery disease, myocardial infarction, systolic dysfunction congestive heart failure, hypertension, aortic insufficiency, diabetes, hyperlipidemia, chronic renal insufficiency, CVA, status post pacemaker placement recently treated for ESBL Escherichia coli pneumonia and urinary tract infection presenting with altered mental status and complaint of abdominal pain and shortness of breath.  1. Altered mental status likely multifactorial with urinary tract infection  plus/minus hypertensive emergency and congestive heart failure. Abdominal pain likely in the setting of urinary tract infection. Will stop her levofloxacin and start on Invanz given her ESBL urinary tract infection, pneumonia on last admission. Resend her urine culture. Continue IV Lasix. Ins and outs, daily weights. Will get echocardiogram. Cycle cardiac enzymes and tele. Will restart her Imdur, aspirin, beta blocker, and statin.  2. Hypertension, malignant, improved. This could be a culprit behind her congestive heart failure and congestion. Will go ahead and restart her Toprol-XL and Imdur. Currently stable.  3. Chronic anemia. Hemoglobin slightly lower from baseline with no source of bleed. She does have history of GI bleed. Start on ranitidine. Follow her hemoglobin and hematocrit.  4. Mild anion gap acidosis with history of chronic renal insufficiency. She has a stable creatinine possibly related to early sepsis. Will send lactate level.  5. Diabetes. Restart her Lantus. Start on sliding scale insulin.  6. Prophylaxis with heparin subcutaneous, aspirin and ranitidine.   TIME SPENT: Approximately 50 minutes spent on patient care.   ____________________________ Reuel DerbyAlounthith Zayli Villafuerte, MD ap:cms D: 12/02/2011 02:31:34 ET T: 12/02/2011 08:56:34 ET JOB#: [id number]  cc: Pearlean BrownieAlounthith Sheyenne Konz, MD, <Dictator> Leo GrosserNancy J. Maloney, MD Reuel DerbyALOUNTHITH Hedy Garro MD ELECTRONICALLY SIGNED 12/04/2011 22:55
